# Patient Record
Sex: Female | Born: 1965 | Race: White | Hispanic: No | Marital: Married | Smoking: Current some day smoker
Health system: Southern US, Community
[De-identification: ages and names within clinical notes are randomized; demographics above are authoritative.]

---

## 2018-12-09 ENCOUNTER — Emergency Department (HOSPITAL_COMMUNITY)
Admission: EM | Admit: 2018-12-09 | Discharge: 2018-12-09 | Disposition: A | Discharge status: Home / Self Care | Payer: BLUE CROSS/BLUE SHIELD | Attending: Emergency Medicine | Admitting: Emergency Medicine

## 2018-12-09 ENCOUNTER — Other Ambulatory Visit: Payer: Self-pay

## 2018-12-09 ENCOUNTER — Emergency Department (HOSPITAL_COMMUNITY): Payer: BLUE CROSS/BLUE SHIELD

## 2018-12-09 ENCOUNTER — Encounter (HOSPITAL_COMMUNITY): Payer: Self-pay | Admitting: Emergency Medicine

## 2018-12-09 DIAGNOSIS — W19XXXA Unspecified fall, initial encounter: Secondary | ICD-10-CM | POA: Diagnosis not present

## 2018-12-09 DIAGNOSIS — Y929 Unspecified place or not applicable: Secondary | ICD-10-CM | POA: Insufficient documentation

## 2018-12-09 DIAGNOSIS — Z23 Encounter for immunization: Secondary | ICD-10-CM | POA: Diagnosis not present

## 2018-12-09 DIAGNOSIS — S0990XA Unspecified injury of head, initial encounter: Secondary | ICD-10-CM

## 2018-12-09 DIAGNOSIS — F17210 Nicotine dependence, cigarettes, uncomplicated: Secondary | ICD-10-CM | POA: Diagnosis not present

## 2018-12-09 DIAGNOSIS — S01311A Laceration without foreign body of right ear, initial encounter: Secondary | ICD-10-CM | POA: Insufficient documentation

## 2018-12-09 DIAGNOSIS — Y9389 Activity, other specified: Secondary | ICD-10-CM | POA: Insufficient documentation

## 2018-12-09 DIAGNOSIS — Y998 Other external cause status: Secondary | ICD-10-CM | POA: Insufficient documentation

## 2018-12-09 DIAGNOSIS — S0101XA Laceration without foreign body of scalp, initial encounter: Secondary | ICD-10-CM | POA: Diagnosis not present

## 2018-12-09 MED ORDER — CIPROFLOXACIN HCL 500 MG PO TABS: 500.0000 mg | ORAL_TABLET | Freq: Two times a day (BID) | ORAL | 0 refills | Status: DC

## 2018-12-09 MED ORDER — LIDOCAINE-EPINEPHRINE (PF) 2 %-1:200000 IJ SOLN
10.0000 mL | Freq: Once | INTRAMUSCULAR | Status: AC
  Administered 2018-12-09: 10 mL
  Filled 2018-12-09: qty 20

## 2018-12-09 MED ORDER — CIPROFLOXACIN HCL 500 MG PO TABS: 500.0000 mg | ORAL_TABLET | Freq: Two times a day (BID) | ORAL | 0 refills | Status: AC

## 2018-12-09 MED ORDER — TETANUS-DIPHTH-ACELL PERTUSSIS 5-2.5-18.5 LF-MCG/0.5 IM SUSP
0.5000 mL | Freq: Once | INTRAMUSCULAR | Status: AC
  Administered 2018-12-09: 0.5 mL via INTRAMUSCULAR
  Filled 2018-12-09: qty 0.5

## 2018-12-09 NOTE — ED Triage Notes (Signed)
Patient here from home with complaints of fall. Reports that she tripped and fell. Lacerations noted to head and ear. Denies LOC, denies n/v. No blood thinners.

## 2018-12-09 NOTE — ED Provider Notes (Signed)
[facility]-EMERGENCY DEPT Provider Note   CSN: [id number] Arrival date & time: 12/09/18  0316    History   Chief Complaint Chief Complaint  Patient presents with  . Fall  . Head Injury  . Head Laceration    HPI Cheryl Waters is a 53 y.o. female.     53 yo F with a chief complaint of fall.  The patient had a few drinks tonight and was smoking a cigarette when she lost her balance and she fell onto her head.  She denies loss of consciousness denies vomiting denies confusion other than intoxication.  Denies extremity pain denies chest pain denies abdominal pain.  Unsure of her last tetanus.   Fall  Pertinent negatives include no chest pain, no headaches and no shortness of breath.  Head Injury  Associated symptoms: no headaches, no nausea and no vomiting   Head Laceration  Pertinent negatives include no chest pain, no headaches and no shortness of breath.    History reviewed. No pertinent past medical history.  There are no active problems to display for this patient.   History reviewed. No pertinent surgical history.   OB History   No obstetric history on file.      Home Medications    Prior to Admission medications   Medication Sig Start Date End Date Taking? Authorizing Provider  ciprofloxacin (CIPRO) 500 MG tablet Take 1 tablet (500 mg total) by mouth 2 (two) times daily. One po bid x 7 days 12/09/18   Melene Plan, DO    Family History No family history on file.  Social History Social History   Tobacco Use  . Smoking status: Current Some Day Smoker  . Smokeless tobacco: Never Used  Substance Use Topics  . Alcohol use: Not on file  . Drug use: Not on file     Allergies   Penicillins and Sulfa antibiotics   Review of Systems Review of Systems  Constitutional: Negative for chills and fever.  HENT: Negative for congestion and rhinorrhea.   Eyes: Negative for redness and visual disturbance.  Respiratory: Negative for  shortness of breath and wheezing.   Cardiovascular: Negative for chest pain and palpitations.  Gastrointestinal: Negative for nausea and vomiting.  Genitourinary: Negative for dysuria and urgency.  Musculoskeletal: Negative for arthralgias and myalgias.  Skin: Positive for wound. Negative for pallor.  Neurological: Negative for dizziness and headaches.     Physical Exam Updated Vital Signs BP (!) 142/92 (BP Location: Left Arm)   Pulse 90   Temp (!) 97.4 F (36.3 C) (Oral) Comment: pt was drinking cold water  Resp 17   Ht 5\' 7"  (1.702 m)   Wt 65.8 kg   SpO2 100%   BMI 22.71 kg/m   Physical Exam Vitals signs and nursing note reviewed.  Constitutional:      General: She is not in acute distress.    Appearance: She is well-developed. She is not diaphoretic.  HENT:     Head: Normocephalic.     Comments: 2 large lacerations about 7 cm in length to the vertex of the head.  Superficial but gaping.    Ears:     Comments: Avulsion injury to the antitragus the curve that attaches to the tragus.  There is exposed cartilage and there is a laceration that goes medially. Eyes:     Pupils: Pupils are equal, round, and reactive to light.  Neck:     Musculoskeletal: Normal range of motion and neck supple.  Cardiovascular:  Rate and Rhythm: Normal rate and regular rhythm.     Heart sounds: No murmur. No friction rub. No gallop.   Pulmonary:     Effort: Pulmonary effort is normal.     Breath sounds: No wheezing or rales.  Abdominal:     General: There is no distension.     Palpations: Abdomen is soft.     Tenderness: There is no abdominal tenderness.  Musculoskeletal:        General: No tenderness.  Skin:    General: Skin is warm and dry.  Neurological:     Mental Status: She is alert and oriented to person, place, and time.  Psychiatric:        Behavior: Behavior normal.        ED Treatments / Results  Labs (all labs ordered are listed, but only abnormal results are  displayed) Labs Reviewed - No data to display  EKG None  Radiology Ct Head Wo Contrast  Result Date: 12/09/2018 CLINICAL DATA:  53 year old female with fall and head trauma. EXAM: CT HEAD WITHOUT CONTRAST TECHNIQUE: Contiguous axial images were obtained from the base of the skull through the vertex without intravenous contrast. COMPARISON:  None. FINDINGS: Brain: The ventricles and sulci appropriate size for patient's age. The gray-white matter discrimination is preserved. There is no acute intracranial hemorrhage. No mass effect or midline shift. No extra-axial fluid collection. Vascular: No hyperdense vessel or unexpected calcification. Skull: Normal. Negative for fracture or focal lesion. Sinuses/Orbits: No acute finding. Other: Lacerations of the skin over the right frontal and parietal convexity with small scalp hematoma. IMPRESSION: No acute intracranial pathology. Electronically Signed   By: Elgie Collard M.D.   On: 12/09/2018 05:48    Procedures .Marland KitchenLaceration Repair Date/Time: 12/09/2018 7:31 AM Performed by: Melene Plan, DO Authorized by: Melene Plan, DO   Consent:    Consent obtained:  Verbal   Consent given by:  Patient   Risks discussed:  Infection and pain   Alternatives discussed:  No treatment, delayed treatment and observation Anesthesia (see MAR for exact dosages):    Anesthesia method:  Local infiltration   Local anesthetic:  Lidocaine 1% WITH epi Laceration details:    Location:  Scalp   Scalp location:  Crown   Length (cm):  15 Repair type:    Repair type:  Simple Pre-procedure details:    Preparation:  Patient was prepped and draped in usual sterile fashion Exploration:    Hemostasis achieved with:  Direct pressure and epinephrine   Wound exploration: wound explored through full range of motion and entire depth of wound probed and visualized     Wound extent: fascia violated     Contaminated: no   Treatment:    Area cleansed with:  Saline   Amount of  cleaning:  Extensive   Irrigation solution:  Sterile saline   Irrigation volume:  50   Irrigation method:  Pressure wash and syringe   Visualized foreign bodies/material removed: no   Skin repair:    Repair method:  Staples   Number of staples:  10 Approximation:    Approximation:  Close Post-procedure details:    Dressing:  Open (no dressing)   Patient tolerance of procedure:  Tolerated well, no immediate complications .Marland KitchenLaceration Repair Date/Time: 12/09/2018 7:32 AM Performed by: Melene Plan, DO Authorized by: Melene Plan, DO   Consent:    Consent obtained:  Verbal   Consent given by:  Patient   Risks discussed:  Infection, pain, need for additional  repair, poor cosmetic result, poor wound healing and nerve damage   Alternatives discussed:  No treatment, delayed treatment and observation Anesthesia (see MAR for exact dosages):    Anesthesia method:  Local infiltration   Local anesthetic:  Lidocaine 1% WITH epi Laceration details:    Location:  Scalp   Scalp location:  Crown   Length (cm):  10 Repair type:    Repair type:  Simple Pre-procedure details:    Preparation:  Patient was prepped and draped in usual sterile fashion Exploration:    Hemostasis achieved with:  Direct pressure and epinephrine   Wound exploration: wound explored through full range of motion     Wound extent: fascia violated     Contaminated: no   Treatment:    Area cleansed with:  Saline   Amount of cleaning:  Standard   Irrigation solution:  Sterile saline   Irrigation volume:  50   Irrigation method:  Syringe   Visualized foreign bodies/material removed: no   Skin repair:    Repair method:  Staples   Number of staples:  6 Approximation:    Approximation:  Close Post-procedure details:    Dressing:  Open (no dressing)   Patient tolerance of procedure:  Tolerated well, no immediate complications .Marland KitchenLaceration Repair Date/Time: 12/09/2018 7:33 AM Performed by: Melene Plan, DO Authorized by:  Melene Plan, DO   Consent:    Consent obtained:  Verbal   Consent given by:  Patient   Risks discussed:  Infection, pain, need for additional repair, poor cosmetic result, nerve damage and poor wound healing   Alternatives discussed:  No treatment and delayed treatment Anesthesia (see MAR for exact dosages):    Anesthesia method:  Local infiltration   Local anesthetic:  Lidocaine 1% w/o epi Laceration details:    Location:  Ear   Ear location:  R ear   Length (cm):  1.5 Repair type:    Repair type:  Complex Exploration:    Limited defect created (wound extended): no     Hemostasis achieved with:  Epinephrine and direct pressure   Wound exploration: wound explored through full range of motion and entire depth of wound probed and visualized     Wound extent comment:  Avulsed tissue with exposed cartilage   Contaminated: no   Treatment:    Area cleansed with:  Saline   Amount of cleaning:  Extensive   Irrigation solution:  Sterile saline   Irrigation volume:  60   Irrigation method:  Pressure wash and syringe   Visualized foreign bodies/material removed: no     Debridement:  None   Undermining:  None   Scar revision: no   Skin repair:    Repair method:  Sutures   Suture size:  6-0   Suture material:  Fast-absorbing gut   Suture technique:  Simple interrupted   Number of sutures:  4 Approximation:    Approximation:  Close Post-procedure details:    Dressing:  Open (no dressing)   Patient tolerance of procedure:  Tolerated well, no immediate complications   (including critical care time)  Medications Ordered in ED Medications  lidocaine-EPINEPHrine (XYLOCAINE W/EPI) 2 %-1:200000 (PF) injection 10 mL (10 mLs Infiltration Given 12/09/18 0551)  Tdap (BOOSTRIX) injection 0.5 mL (0.5 mLs Intramuscular Given 12/09/18 0551)     Initial Impression / Assessment and Plan / ED Course  I have reviewed the triage vital signs and the nursing notes.  Pertinent labs & imaging results  that were available during my  care of the patient were reviewed by me and considered in my medical decision making (see chart for details).        53 yo F with a chief complaint of a fall.  As the patient was intoxicated will obtain a CT of the head.  Update her tetanus.  I will close the lacerations on her scalp.  I am concerned about the exposed cartilage on the ear, will discuss with ENT.  I discussed the case with Dr. Doran HeaterMarcellino, she was able to review the images and felt that this area would be okay for bedside closure in the emergency department.  She recommended closing the skin as best I could over the cartilage starting her on a short course of ciprofloxacin and having her keep it moist with bacitracin.  We [place] gets back to her hometown.  CT of the head is negative.  7:35 AM:  I have discussed the diagnosis/risks/treatment options with the patient and family and believe the pt to be eligible for discharge home to follow-up with PCP. We also discussed returning to the ED immediately if new or worsening sx occur. We discussed the sx which are most concerning (e.g., sudden worsening pain, fever, inability to tolerate by mouth) that necessitate immediate return. Medications administered to the patient during their visit and any new prescriptions provided to the patient are listed below.  Medications given during this visit Medications  lidocaine-EPINEPHrine (XYLOCAINE W/EPI) 2 %-1:200000 (PF) injection 10 mL (10 mLs Infiltration Given 12/09/18 0551)  Tdap (BOOSTRIX) injection 0.5 mL (0.5 mLs Intramuscular Given 12/09/18 0551)     The patient appears reasonably screen and/or stabilized for discharge and I doubt any other medical condition or other [facility] requiring further screening, evaluation, or treatment in the ED at this time prior to discharge.    Final Clinical Impressions(s) / ED Diagnoses   Final diagnoses:  Injury of  head, initial encounter  Laceration of scalp without foreign body, initial encounter  Laceration of earlobe, right, initial encounter    ED Discharge Orders         Ordered    ciprofloxacin (CIPRO) 500 MG tablet  2 times daily     12/09/18 0657           Melene PlanFloyd, Eulis Salazar, DO 12/09/18 [phone]

## 2018-12-09 NOTE — Discharge Instructions (Addendum)
Bacitracin ointment over the wound of the ear until the skin heals.   Return for redness, fever, drainage.   The staples should be removed in 5-7 days.

## 2020-07-07 IMAGING — CT CT HEAD W/O CM
3 series · 16 of 47 positions shown, 19 images · non-contrast
Comparison: None.

CLINICAL DATA: 52-year-old female with fall and head trauma.

EXAM:
CT HEAD WITHOUT CONTRAST
TECHNIQUE: Contiguous axial images were obtained from the base of the skull
through the vertex without intravenous contrast.

[Series 2: head wo · axial · 0.47mm/px · z∈[-53,+77]mm · 10 of 32 slices shown, 13 images]
[im 3/32  brain]
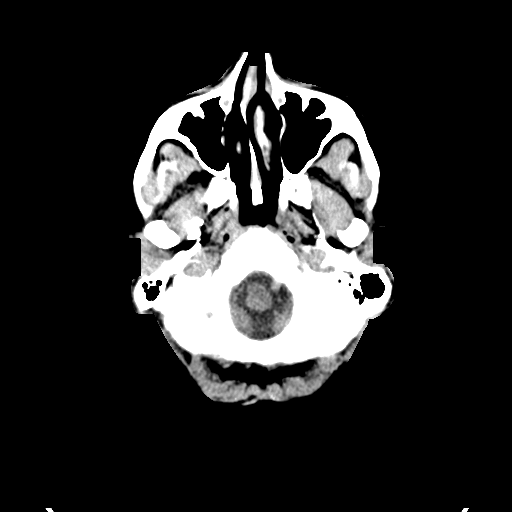
[im 3/32  bone]
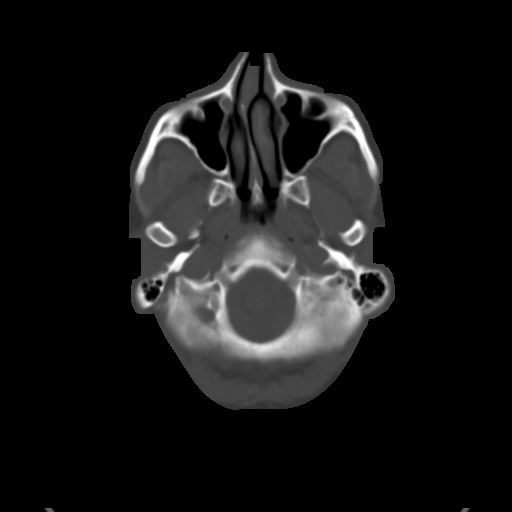
[im 6/32  brain]
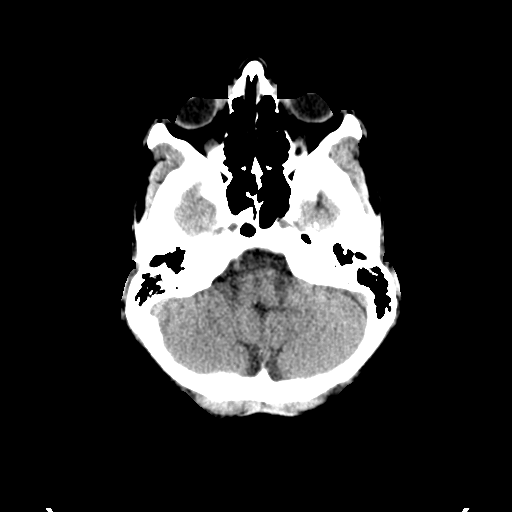
[im 9/32  brain]
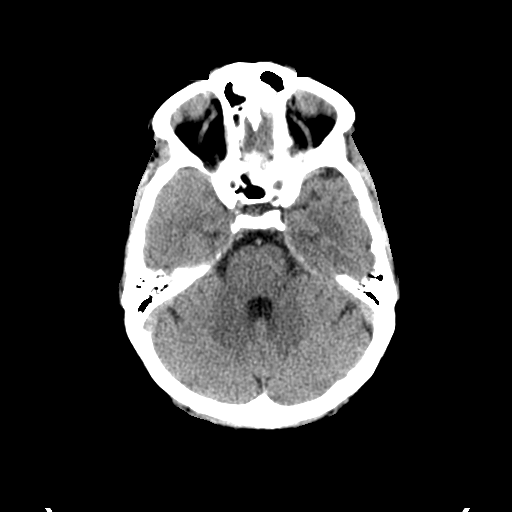
[im 11/32  brain]
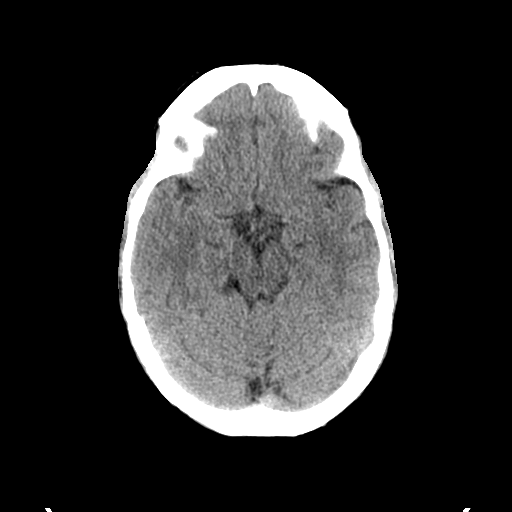
[im 14/32  brain]
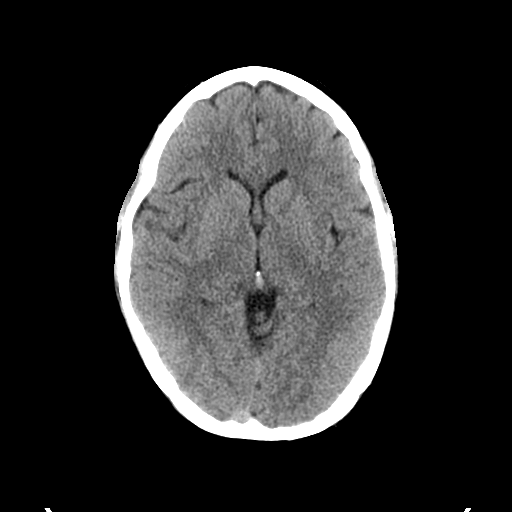
[im 14/32  bone]
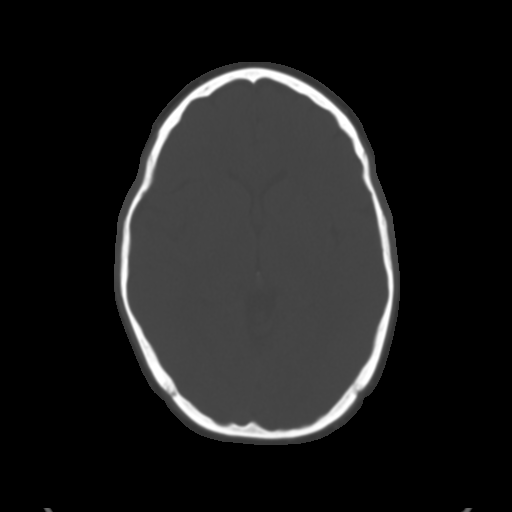
[im 18/32  brain]
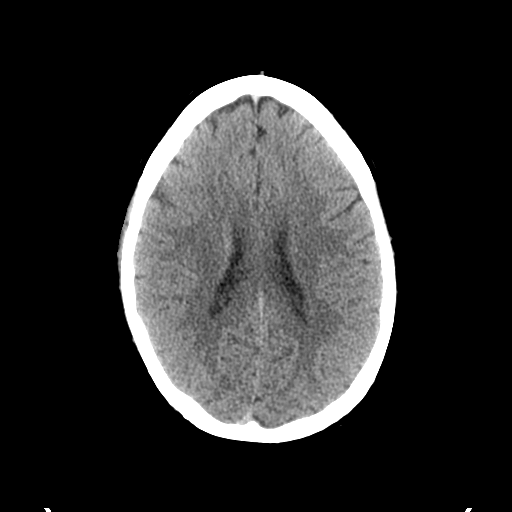
[im 21/32  brain]
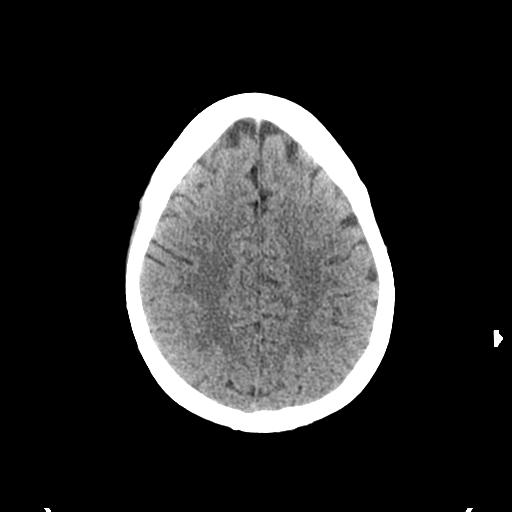
[im 24/32  brain]
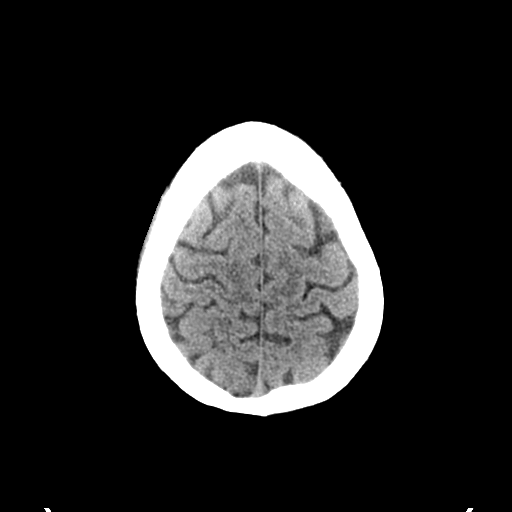
[im 26/32  brain]
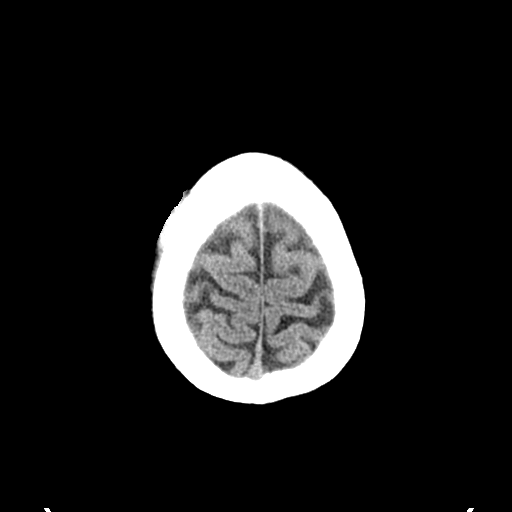
[im 26/32  bone]
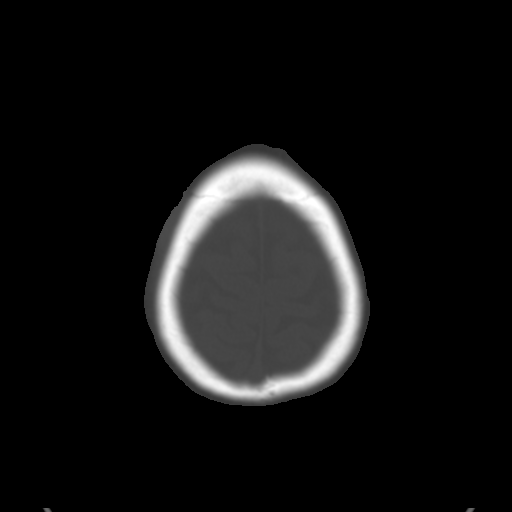
[im 29/32  brain]
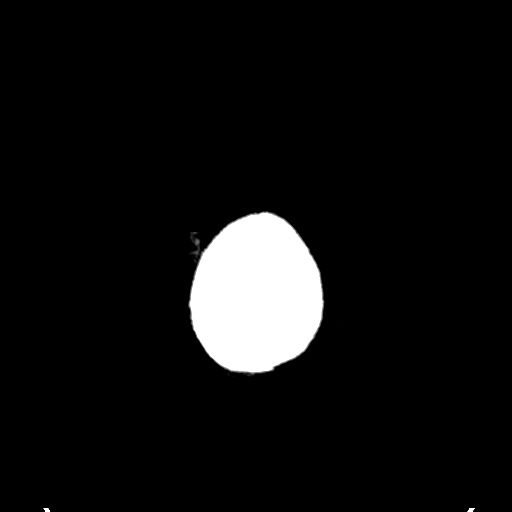

[Series 5: coronal soft tissue · coronal · 0.29mm/px · 3 of 68 slices shown]
[im 23/68  brain]
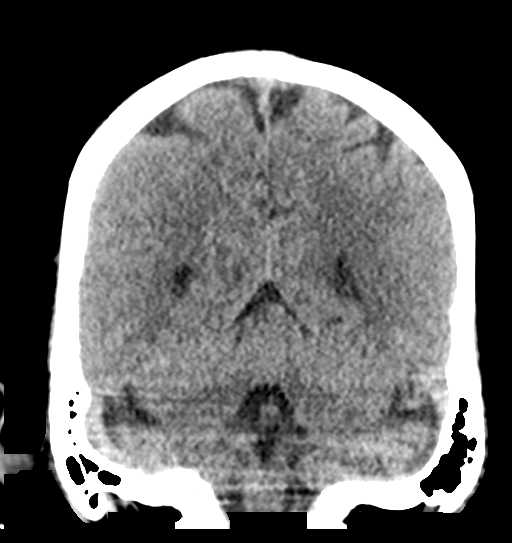
[im 30/68  brain]
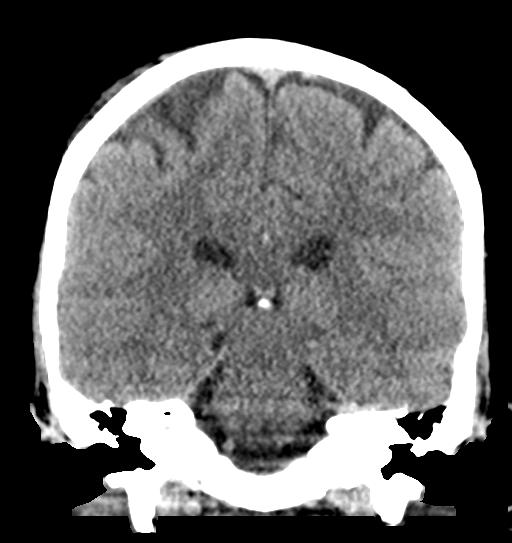
[im 38/68  brain]
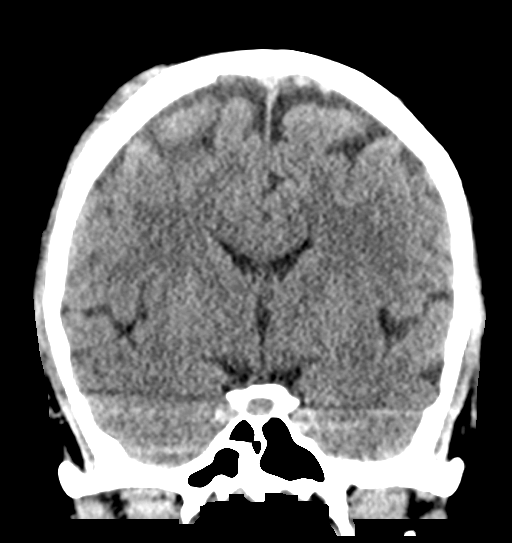

[Series 6: sagittal soft tissue · sagittal · 0.31mm/px · 3 of 58 slices shown]
[im 20/58  brain]
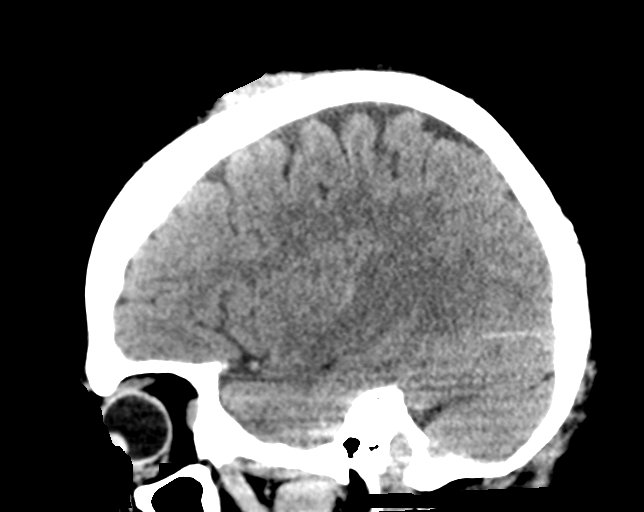
[im 29/58  brain]
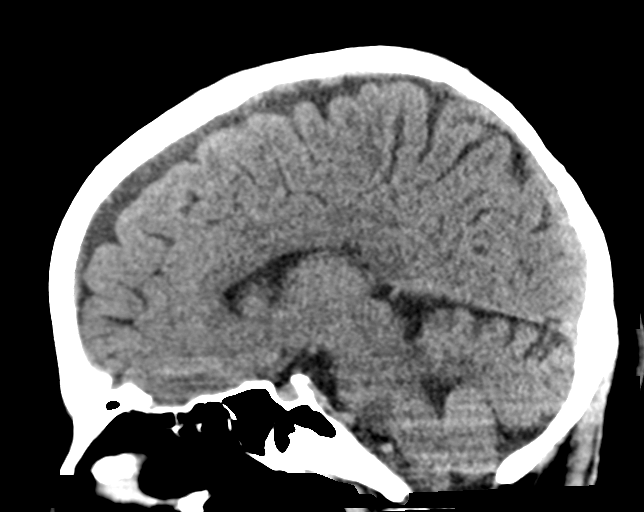
[im 39/58  brain]
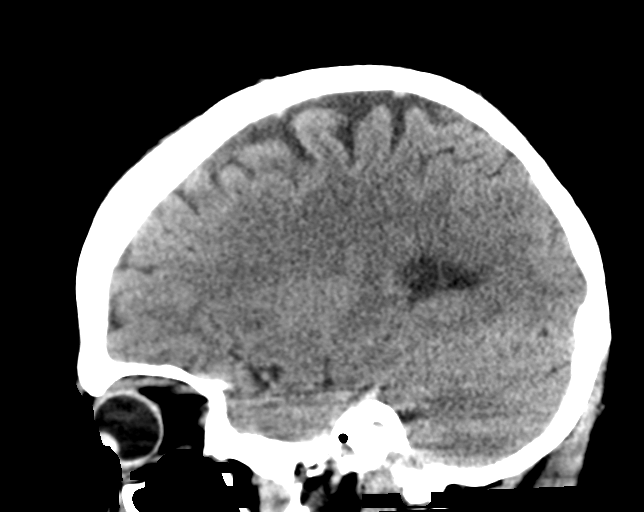

[16 of 47 positions shown; findings below may reference images not displayed]

FINDINGS: Brain: The ventricles and sulci appropriate size for patient's age.
The gray-white matter discrimination is preserved. There is no acute
intracranial hemorrhage. No mass effect or midline shift. No
extra-axial fluid collection.

Vascular: No hyperdense vessel or unexpected calcification.

Skull: Normal. Negative for fracture or focal lesion.

Sinuses/Orbits: No acute finding.

Other: Lacerations of the skin over the right frontal and parietal
convexity with small scalp hematoma.
IMPRESSION: No acute intracranial pathology.
